# Patient Record
Sex: Female | Born: 1965
Health system: Southern US, Community
[De-identification: ages and names within clinical notes are randomized; demographics above are authoritative.]

---

## 2019-11-21 ENCOUNTER — Emergency Department (HOSPITAL_BASED_OUTPATIENT_CLINIC_OR_DEPARTMENT_OTHER): Payer: 59

## 2019-11-21 ENCOUNTER — Encounter (HOSPITAL_BASED_OUTPATIENT_CLINIC_OR_DEPARTMENT_OTHER): Payer: Self-pay | Admitting: *Deleted

## 2019-11-21 ENCOUNTER — Other Ambulatory Visit: Payer: Self-pay

## 2019-11-21 ENCOUNTER — Emergency Department (HOSPITAL_BASED_OUTPATIENT_CLINIC_OR_DEPARTMENT_OTHER)
Admission: EM | Admit: 2019-11-21 | Discharge: 2019-11-21 | Disposition: A | Discharge status: Home / Self Care | Payer: 59 | Attending: Emergency Medicine | Admitting: Emergency Medicine

## 2019-11-21 DIAGNOSIS — W1839XA Other fall on same level, initial encounter: Secondary | ICD-10-CM | POA: Diagnosis not present

## 2019-11-21 DIAGNOSIS — Y999 Unspecified external cause status: Secondary | ICD-10-CM | POA: Diagnosis not present

## 2019-11-21 DIAGNOSIS — Y92009 Unspecified place in unspecified non-institutional (private) residence as the place of occurrence of the external cause: Secondary | ICD-10-CM | POA: Diagnosis not present

## 2019-11-21 DIAGNOSIS — S52532A Colles' fracture of left radius, initial encounter for closed fracture: Secondary | ICD-10-CM | POA: Diagnosis not present

## 2019-11-21 DIAGNOSIS — Y9389 Activity, other specified: Secondary | ICD-10-CM | POA: Diagnosis not present

## 2019-11-21 DIAGNOSIS — S62102A Fracture of unspecified carpal bone, left wrist, initial encounter for closed fracture: Secondary | ICD-10-CM

## 2019-11-21 DIAGNOSIS — S6992XA Unspecified injury of left wrist, hand and finger(s), initial encounter: Secondary | ICD-10-CM | POA: Diagnosis present

## 2019-11-21 MED ORDER — HYDROCODONE-ACETAMINOPHEN 5-325 MG PO TABS
1.0000 | ORAL_TABLET | Freq: Once | ORAL | Status: AC
  Administered 2019-11-21: 08:00:00 1 via ORAL
  Filled 2019-11-21: qty 1

## 2019-11-21 MED ORDER — OXYCODONE HCL 5 MG PO TABS: 5.0000 mg | ORAL_TABLET | Freq: Four times a day (QID) | ORAL | 0 refills | Status: AC | PRN

## 2019-11-21 MED FILL — oxyCODONE HCL 5 MG TABS: 5 | 5 days supply | Qty: 20 | Fill #0

## 2019-11-21 NOTE — ED Provider Notes (Signed)
[facility] Provider Note   CSN: [id number] Arrival date & time: 11/21/19  7829     History   Chief Complaint Chief Complaint  Patient presents with   Fall    HPI Diane Brown is a 53 y.o. female.     Patient was reaching high up in the cabinet for an object when she fell back on her left wrist.  Has pain and swelling to the back of the left wrist.  Has not tried anything to help relieve the pain.  Movement makes it worse.  Denies any medical history.  This was mechanical in nature.  Denies any chest pain, shortness of breath.  Patient is right-hand dominant.  The history is provided by the patient.  Wrist Pain This is a new problem. The current episode started less than 1 hour ago. The problem occurs constantly. Pertinent negatives include no chest pain, no abdominal pain, no headaches and no shortness of breath. Nothing aggravates the symptoms. Nothing relieves the symptoms. She has tried nothing for the symptoms. The treatment provided no relief.    History reviewed. No pertinent past medical history.  There are no active problems to display for this patient.     OB History    Gravida  3   Para  3   Term      Preterm      AB      Living        SAB      TAB      Ectopic      Multiple      Live Births               Home Medications    Prior to Admission medications   Medication Sig Start Date End Date Taking? Authorizing Provider  oxyCODONE (ROXICODONE) 5 MG immediate release tablet Take 1 tablet (5 mg total) by mouth every 6 (six) hours as needed for up to 20 doses for severe pain or breakthrough pain. 11/21/19   Lennice Sites, DO    Family History History reviewed. No pertinent family history.  Social History Social History   Tobacco Use   Smoking status: Never Smoker   Smokeless tobacco: Never Used  Substance Use Topics   Alcohol use: Never    Frequency: Never   Drug use: Never      Allergies   Patient has no known allergies.   Review of Systems Review of Systems  Constitutional: Negative for chills and fever.  HENT: Negative for ear pain and sore throat.   Eyes: Negative for pain and visual disturbance.  Respiratory: Negative for cough and shortness of breath.   Cardiovascular: Negative for chest pain and palpitations.  Gastrointestinal: Negative for abdominal pain and vomiting.  Genitourinary: Negative for dysuria and hematuria.  Musculoskeletal: Negative for arthralgias and back pain.  Skin: Negative for color change and rash.  Neurological: Negative for seizures, syncope and headaches.  All other systems reviewed and are negative.    Physical Exam Updated Vital Signs  ED Triage Vitals  Enc Vitals Group     BP 11/21/19 0744 102/76     Pulse Rate 11/21/19 0744 66     Resp 11/21/19 0744 16     Temp 11/21/19 0744 (!) 97.3 F (36.3 C)     Temp Source 11/21/19 0744 Oral     SpO2 11/21/19 0744 100 %     Weight 11/21/19 0749 142 lb 4.8 oz (64.5 kg)  Height 11/21/19 0749 5\' 1"  (1.549 m)     Head Circumference --      Peak Flow --      Pain Score 11/21/19 0748 10     Pain Loc --      Pain Edu? --      Excl. in GC? --     Physical Exam Vitals signs and nursing note reviewed.  Constitutional:      General: She is not in acute distress.    Appearance: She is well-developed.  HENT:     Head: Normocephalic and atraumatic.  Eyes:     Conjunctiva/sclera: Conjunctivae normal.  Neck:     Musculoskeletal: Neck supple.  Cardiovascular:     Rate and Rhythm: Normal rate and regular rhythm.     Pulses: Normal pulses.     Heart sounds: Normal heart sounds. No murmur.  Pulmonary:     Effort: Pulmonary effort is normal. No respiratory distress.     Breath sounds: Normal breath sounds.  Abdominal:     Palpations: Abdomen is soft.     Tenderness: There is no abdominal tenderness.  Musculoskeletal:        General: Swelling, tenderness, deformity  (left wrist) and signs of injury present.     Comments: Decreased range of motion at the left wrist secondary to pain however good range of motion also throughout left upper extremity.  No bony tenderness other than at left wrist  Skin:    General: Skin is warm and dry.     Capillary Refill: Capillary refill takes less than 2 seconds.  Neurological:     General: No focal deficit present.     Mental Status: She is alert and oriented to person, place, and time.     Sensory: No sensory deficit.     Motor: No weakness.     Comments: Strength and sensation intact in the left upper extremity      ED Treatments / Results  Labs (all labs ordered are listed, but only abnormal results are displayed) Labs Reviewed - No data to display  EKG None  Radiology Dg Wrist Complete Left  Result Date: 11/21/2019 CLINICAL DATA:  Larey SeatFell this morning and injured wrist. EXAM: LEFT HAND - COMPLETE 3+ VIEW; LEFT WRIST - COMPLETE 3+ VIEW COMPARISON:  None. FINDINGS: There is a dorsally impacted intra-articular fracture of the distal radius (Colles type fracture) associated ulnar styloid avulsion fracture. The carpal bones are intact.  No hand fractures are identified. IMPRESSION: Dorsally impacted distal radius fracture (Colles type fracture). Ulnar styloid avulsion fracture. No hand fractures. Electronically Signed   By: Rudie MeyerP.  Gallerani M.D.   On: 11/21/2019 08:12   Dg Hand Complete Left  Result Date: 11/21/2019 CLINICAL DATA:  Larey SeatFell this morning and injured wrist. EXAM: LEFT HAND - COMPLETE 3+ VIEW; LEFT WRIST - COMPLETE 3+ VIEW COMPARISON:  None. FINDINGS: There is a dorsally impacted intra-articular fracture of the distal radius (Colles type fracture) associated ulnar styloid avulsion fracture. The carpal bones are intact.  No hand fractures are identified. IMPRESSION: Dorsally impacted distal radius fracture (Colles type fracture). Ulnar styloid avulsion fracture. No hand fractures. Electronically Signed   By: Rudie MeyerP.   Gallerani M.D.   On: 11/21/2019 08:12    Procedures Procedures (including critical care time)  Medications Ordered in ED Medications  HYDROcodone-acetaminophen (NORCO/VICODIN) 5-325 MG per tablet 1 tablet (1 tablet Oral Given 11/21/19 0801)     Initial Impression / Assessment and Plan / ED Course  I have reviewed  the triage vital signs and the nursing notes.  Pertinent labs & imaging results that were available during my care of the patient were reviewed by me and considered in my medical decision making (see chart for details).     Diane Brown is a 53 year old female with no significant medical history who presents to the ED with left wrist pain after a fall. Patient has some swelling to the left wrist with deformity. Neurovascularly intact. Normal pulses. Good motor function. Good sensation in the left upper extremity. No concern for neurovascular or neuromuscular compromise. X-ray of the left wrist did show Colles' type fracture at the left wrist. Dorsally impacted distal radius fracture with ulnar styloid avulsion fracture. However, overall good alignment. Talked with Dr. Orlan Leavens with hand surgery who will follow up with the patient on Friday. Patient was placed in a splint. Prescribe narcotic pain medicine. Recommend Tylenol, Motrin, ice. Educated about splint care and discharged from the ED in good condition.  This chart was dictated using voice recognition software.  Despite best efforts to proofread,  errors can occur which can change the documentation meaning.    Final Clinical Impressions(s) / ED Diagnoses   Final diagnoses:  Closed fracture of left wrist, initial encounter    ED Discharge Orders         Ordered    oxyCODONE (ROXICODONE) 5 MG immediate release tablet  Every 6 hours PRN     11/21/19 0908           Virgina Norfolk, DO 11/21/19 0908

## 2019-11-21 NOTE — ED Triage Notes (Signed)
[place] this morning while trying to reach the upper cabinet.

## 2020-11-23 IMAGING — CR DG HAND COMPLETE 3+V*L*
3 series · 3 of 3 positions shown · non-contrast
Comparison: None.

CLINICAL DATA: Fell this morning and injured wrist.

EXAM:
LEFT HAND - COMPLETE 3+ VIEW; LEFT WRIST - COMPLETE 3+ VIEW

[x hand pa left]
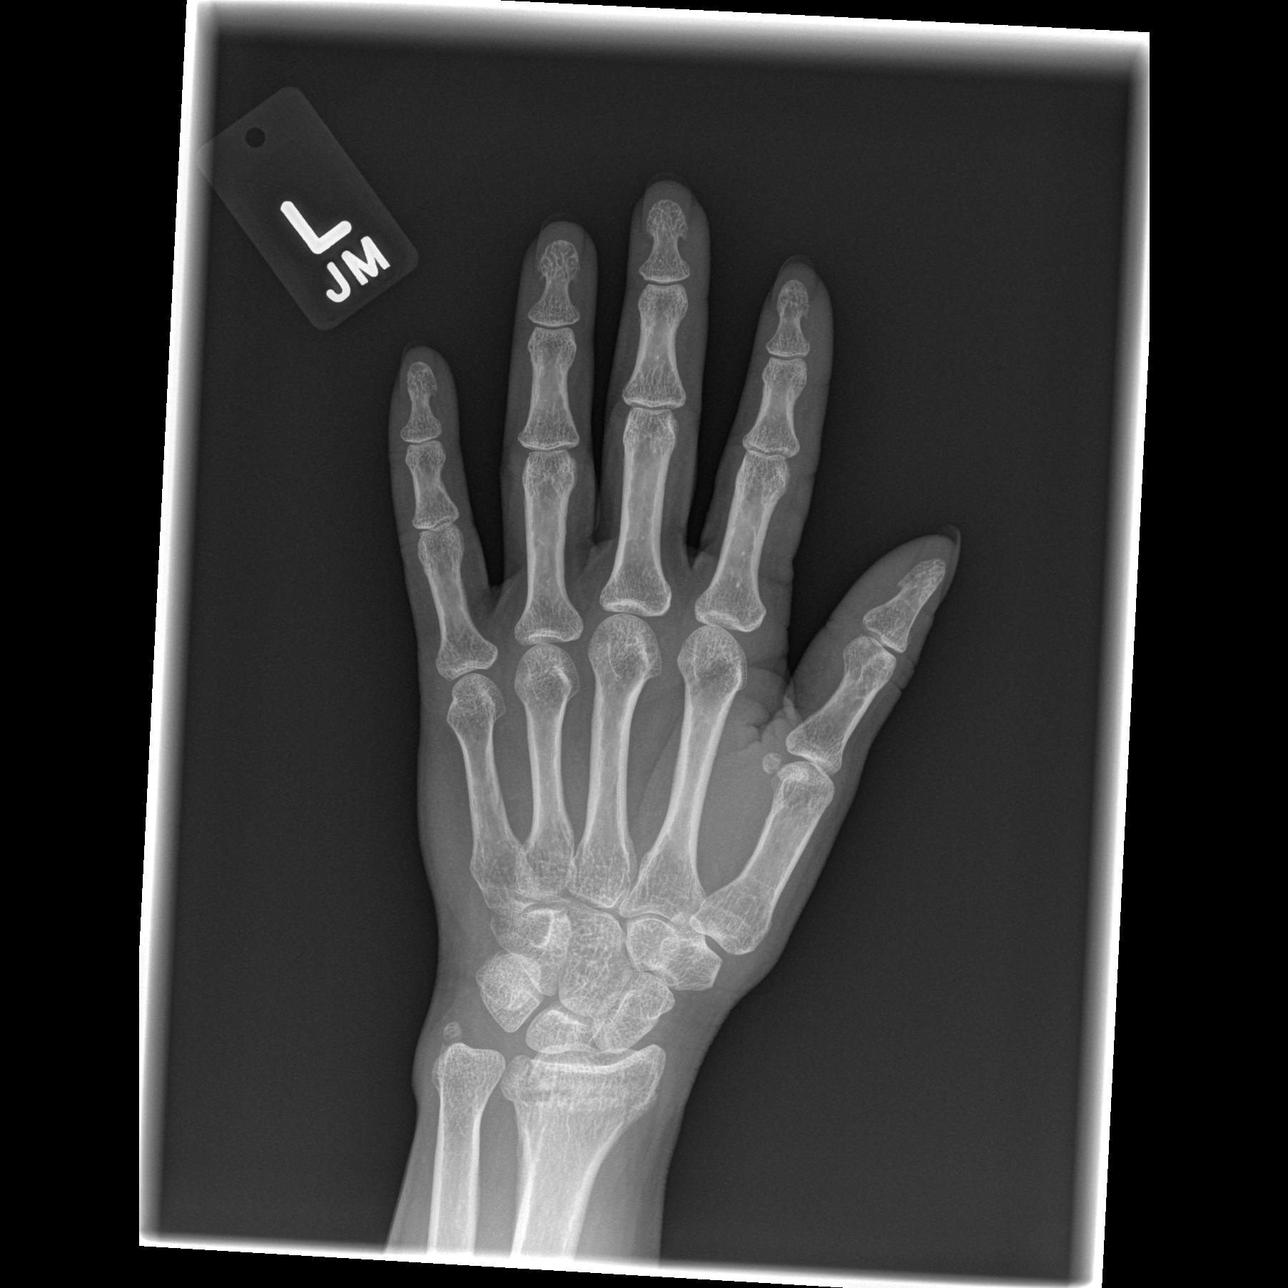

[x hand oblique left]
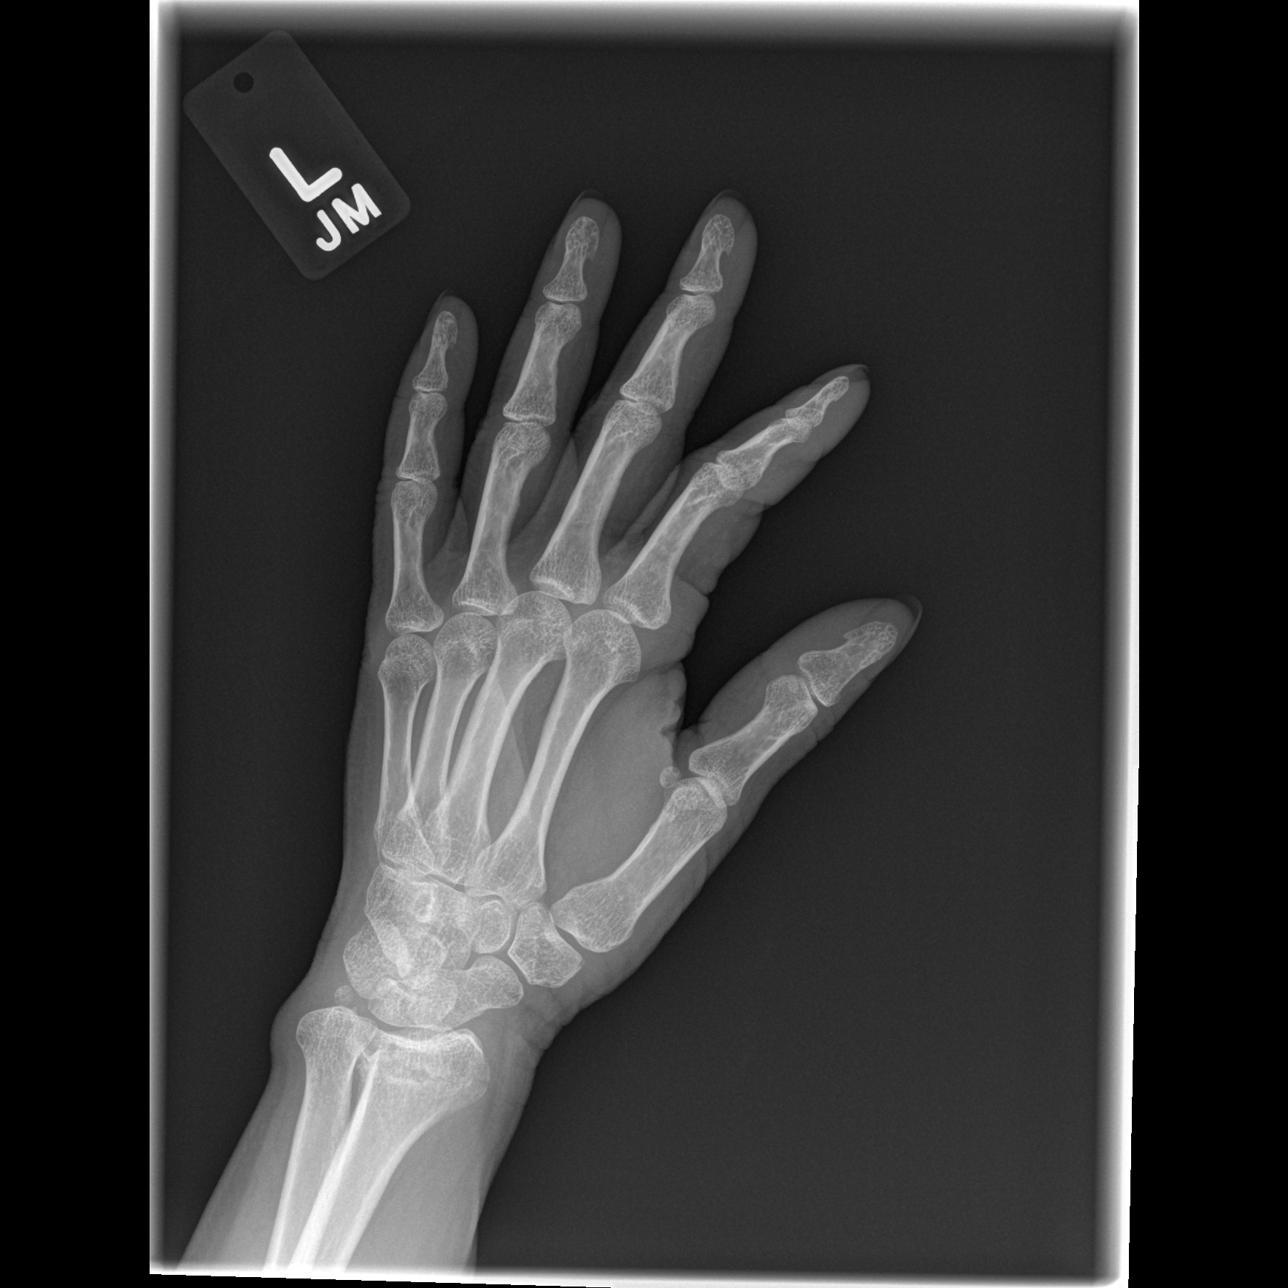

[x hand lat left]
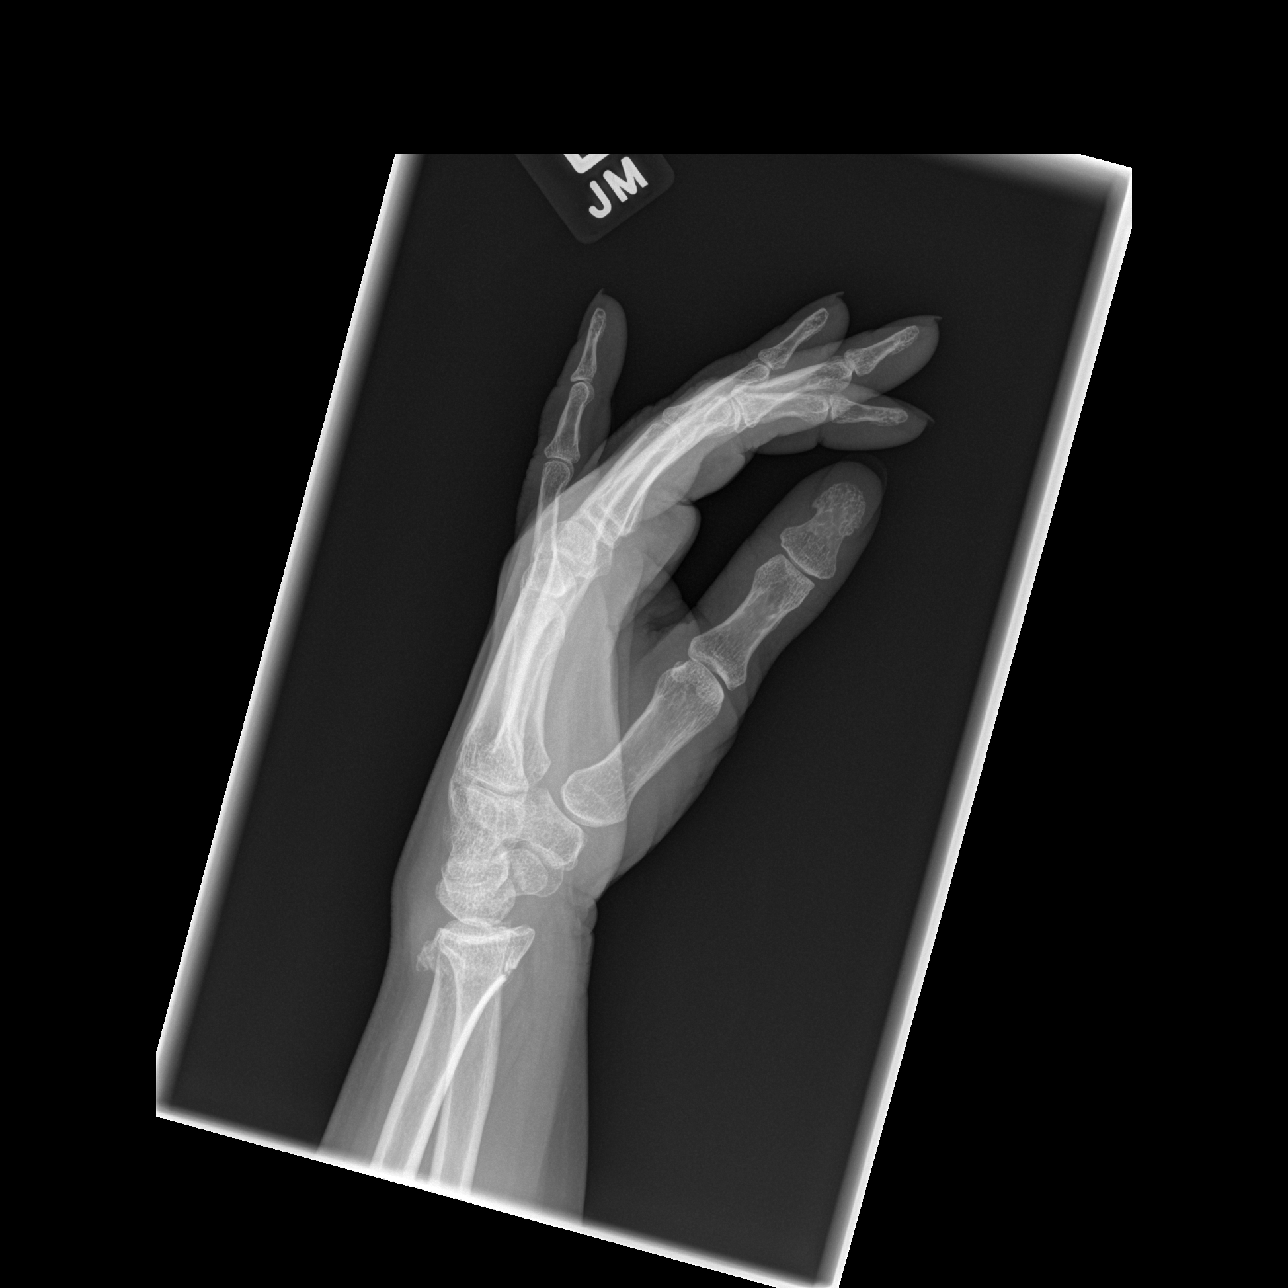

[3 of 3 positions shown; findings below may reference images not displayed]

FINDINGS: There is a dorsally impacted intra-articular fracture of the distal
radius (Colles type fracture) associated ulnar styloid avulsion
fracture.

The carpal bones are intact.  No hand fractures are identified.
IMPRESSION: Dorsally impacted distal radius fracture (Colles type fracture).

Ulnar styloid avulsion fracture.

No hand fractures.

## 2020-11-23 IMAGING — CR DG WRIST COMPLETE 3+V*L*
4 series · 4 of 4 positions shown · non-contrast
Comparison: None.

CLINICAL DATA: Fell this morning and injured wrist.

EXAM:
LEFT HAND - COMPLETE 3+ VIEW; LEFT WRIST - COMPLETE 3+ VIEW

[x wrist lat left]
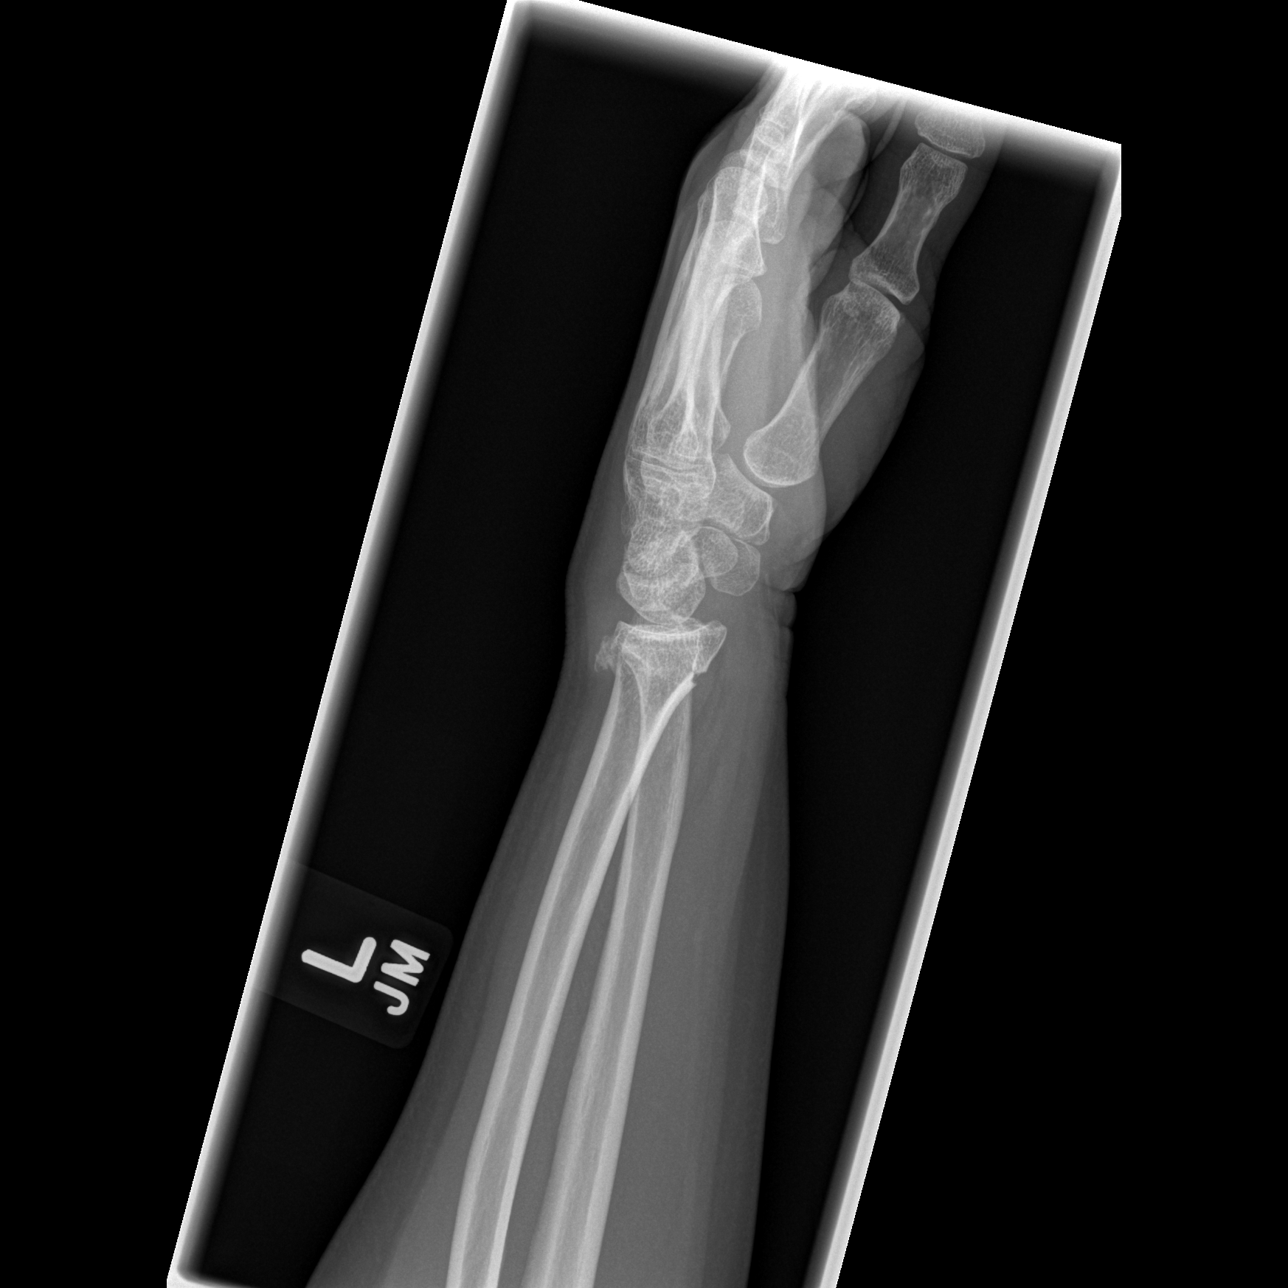

[x wrist pa left]
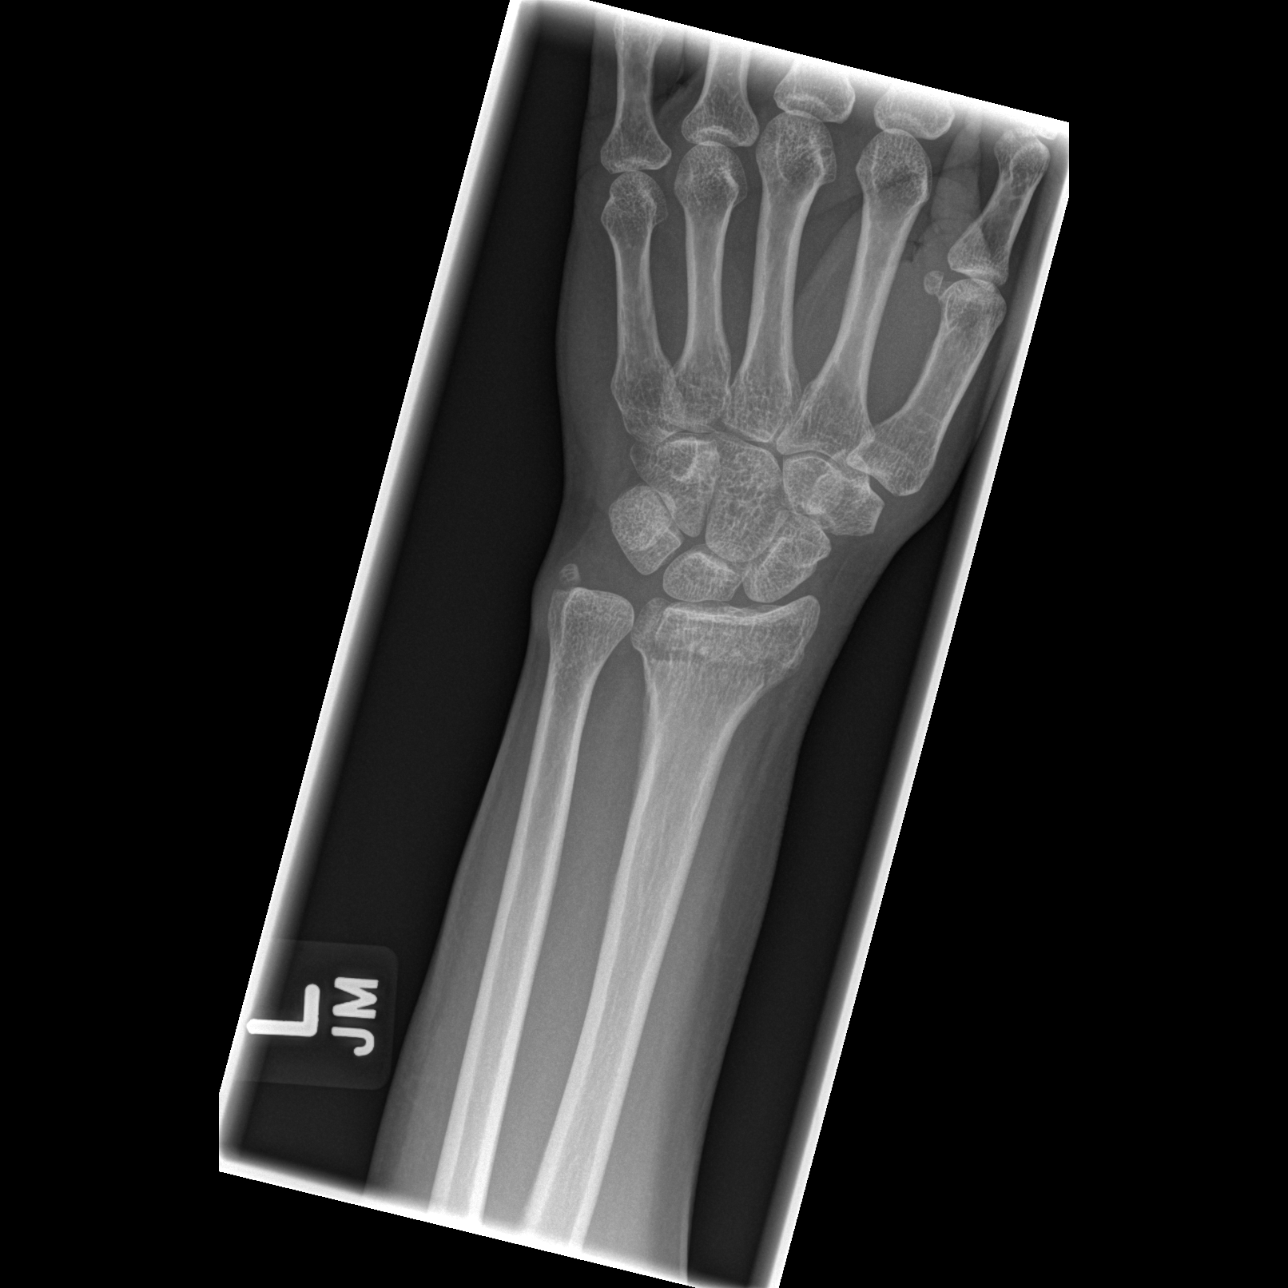

[x wrist obl left]
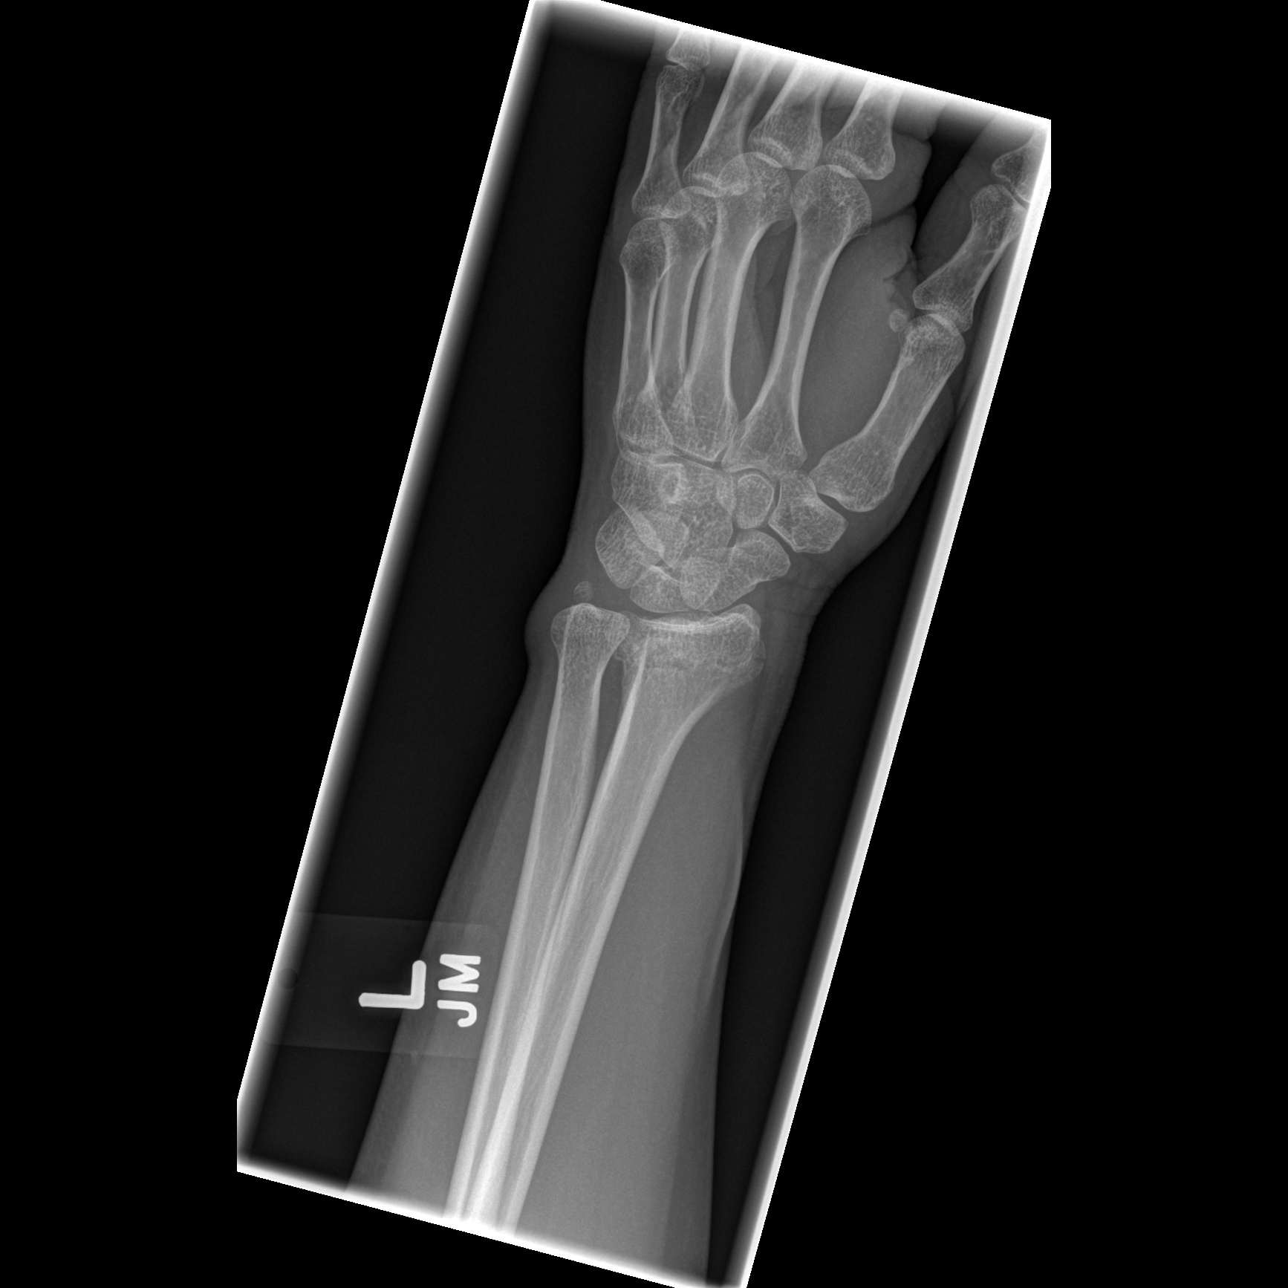

[x navicular]
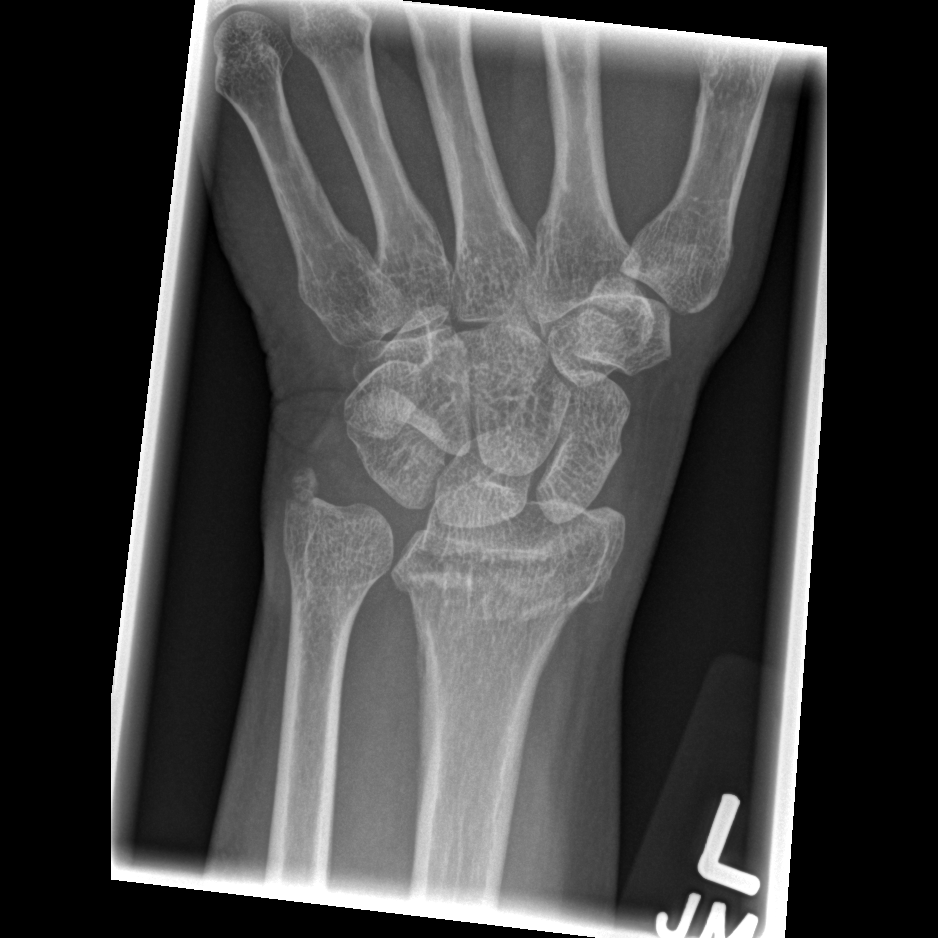

[4 of 4 positions shown; findings below may reference images not displayed]

FINDINGS: There is a dorsally impacted intra-articular fracture of the distal
radius (Colles type fracture) associated ulnar styloid avulsion
fracture.

The carpal bones are intact.  No hand fractures are identified.
IMPRESSION: Dorsally impacted distal radius fracture (Colles type fracture).

Ulnar styloid avulsion fracture.

No hand fractures.
